# Patient Record
Sex: Female | Born: 1965 | Race: White | Hispanic: No | Marital: Married | Smoking: Former smoker
Health system: Southern US, Community
[De-identification: ages and names within clinical notes are randomized; demographics above are authoritative.]

## PROBLEM LIST (undated history)

## (undated) DIAGNOSIS — D649 Anemia, unspecified: Secondary | ICD-10-CM

## (undated) DIAGNOSIS — E785 Hyperlipidemia, unspecified: Secondary | ICD-10-CM

## (undated) DIAGNOSIS — F419 Anxiety disorder, unspecified: Secondary | ICD-10-CM

## (undated) DIAGNOSIS — T7840XA Allergy, unspecified, initial encounter: Secondary | ICD-10-CM

## (undated) HISTORY — DX: Anxiety disorder, unspecified: F41.9

## (undated) HISTORY — DX: Allergy, unspecified, initial encounter: T78.40XA

## (undated) HISTORY — PX: TONSILLECTOMY: SUR1361

## (undated) HISTORY — DX: Hyperlipidemia, unspecified: E78.5

## (undated) HISTORY — DX: Anemia, unspecified: D64.9

---

## 2007-09-07 ENCOUNTER — Ambulatory Visit: Payer: Self-pay

## 2008-01-04 ENCOUNTER — Ambulatory Visit (HOSPITAL_COMMUNITY): Admission: RE | Admit: 2008-01-04 | Discharge: 2008-01-05 | Payer: Self-pay | Admitting: Neurosurgery

## 2008-11-16 ENCOUNTER — Emergency Department: Payer: Self-pay | Admitting: Emergency Medicine

## 2008-12-01 ENCOUNTER — Ambulatory Visit: Payer: Self-pay | Admitting: Obstetrics and Gynecology

## 2009-05-04 ENCOUNTER — Ambulatory Visit: Payer: Self-pay | Admitting: Obstetrics and Gynecology

## 2009-05-17 IMAGING — CR DG LUMBAR SPINE 2-3V
1 series · 1 of 1 positions shown · non-contrast
Comparison: None.

CLINICAL DATA: L5-S1 discectomy.

LUMBAR SPINE - 2-3 VIEW

[view not recorded]
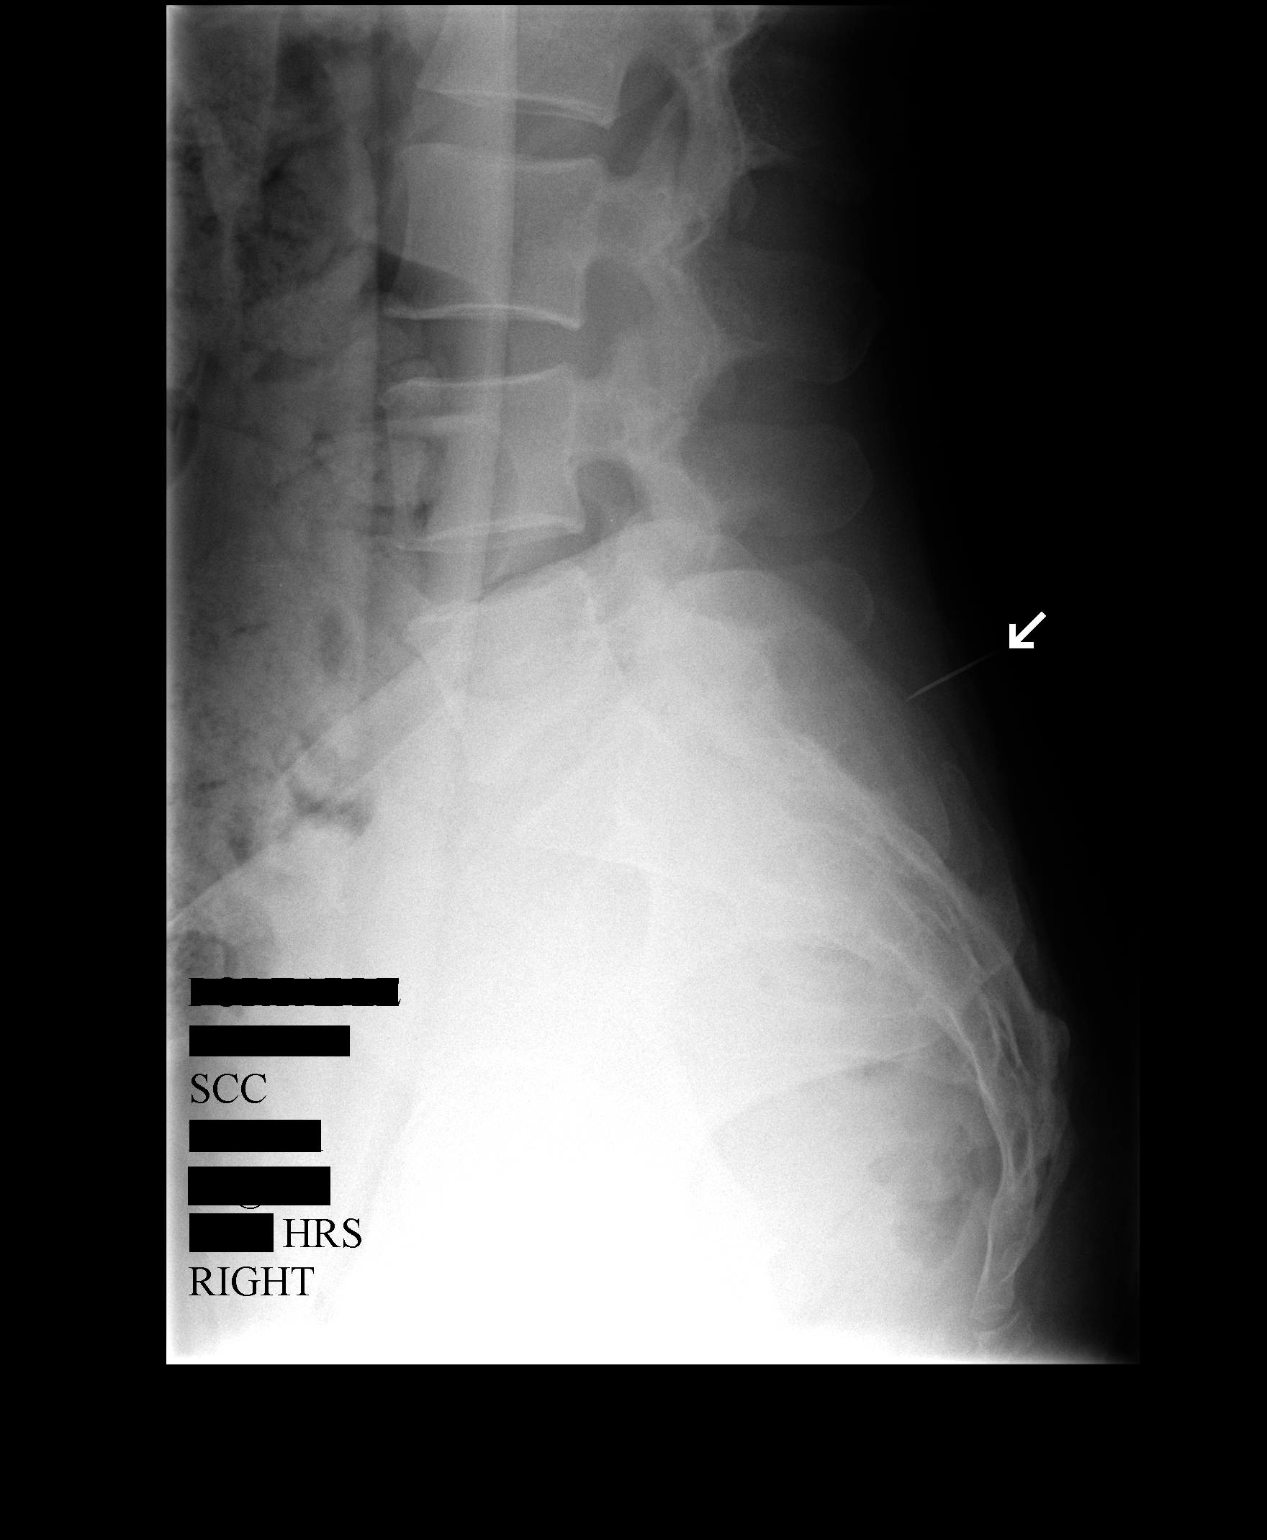

[1 of 1 positions shown; findings below may reference images not displayed]

FINDINGS: We are provided with three images of the lumbar spine in
the lateral projection.  Film labeled #1 at 1170 hours shows a
metallic probe at approximately the S1-2 level.  On film labeled #2
at at 1177 hours, tissues spreaders are in place with a metallic
probe directed toward the L4-5 level. On film labeled #3 at 5056
hours, tissues spreaders are in place metallic probe directed
toward the L5-S1 level.
IMPRESSION: L5-S1 localization.

## 2009-10-09 LAB — CONVERTED CEMR LAB: Pap Smear: NORMAL

## 2009-12-04 ENCOUNTER — Ambulatory Visit: Payer: Self-pay

## 2010-01-11 ENCOUNTER — Ambulatory Visit: Payer: Self-pay | Admitting: Family Medicine

## 2010-01-11 DIAGNOSIS — F411 Generalized anxiety disorder: Secondary | ICD-10-CM | POA: Insufficient documentation

## 2010-01-11 DIAGNOSIS — E785 Hyperlipidemia, unspecified: Secondary | ICD-10-CM

## 2010-01-11 DIAGNOSIS — D649 Anemia, unspecified: Secondary | ICD-10-CM

## 2010-01-11 DIAGNOSIS — R5381 Other malaise: Secondary | ICD-10-CM

## 2010-01-11 DIAGNOSIS — J309 Allergic rhinitis, unspecified: Secondary | ICD-10-CM | POA: Insufficient documentation

## 2010-01-11 DIAGNOSIS — R5383 Other fatigue: Secondary | ICD-10-CM

## 2010-01-11 DIAGNOSIS — J018 Other acute sinusitis: Secondary | ICD-10-CM

## 2010-01-14 ENCOUNTER — Encounter: Payer: Self-pay | Admitting: Family Medicine

## 2010-02-08 ENCOUNTER — Ambulatory Visit: Payer: Self-pay | Admitting: Family Medicine

## 2010-02-08 DIAGNOSIS — J069 Acute upper respiratory infection, unspecified: Secondary | ICD-10-CM | POA: Insufficient documentation

## 2010-02-11 ENCOUNTER — Ambulatory Visit: Payer: Self-pay | Admitting: Family Medicine

## 2010-02-11 DIAGNOSIS — R030 Elevated blood-pressure reading, without diagnosis of hypertension: Secondary | ICD-10-CM

## 2010-03-07 ENCOUNTER — Telehealth: Payer: Self-pay | Admitting: Family Medicine

## 2010-03-27 ENCOUNTER — Ambulatory Visit: Payer: Self-pay | Admitting: Family Medicine

## 2010-04-05 ENCOUNTER — Telehealth: Payer: Self-pay | Admitting: Family Medicine

## 2010-04-15 ENCOUNTER — Telehealth: Payer: Self-pay | Admitting: Family Medicine

## 2010-04-26 ENCOUNTER — Telehealth: Payer: Self-pay | Admitting: Family Medicine

## 2010-05-06 ENCOUNTER — Ambulatory Visit: Payer: Self-pay | Admitting: Family Medicine

## 2010-05-17 ENCOUNTER — Ambulatory Visit: Payer: Self-pay | Admitting: Family Medicine

## 2010-05-17 DIAGNOSIS — G5600 Carpal tunnel syndrome, unspecified upper limb: Secondary | ICD-10-CM

## 2010-05-23 ENCOUNTER — Telehealth: Payer: Self-pay | Admitting: Family Medicine

## 2010-06-28 ENCOUNTER — Telehealth: Payer: Self-pay | Admitting: Family Medicine

## 2010-07-19 ENCOUNTER — Telehealth: Payer: Self-pay | Admitting: Family Medicine

## 2010-08-20 ENCOUNTER — Telehealth: Payer: Self-pay | Admitting: Family Medicine

## 2010-08-26 ENCOUNTER — Ambulatory Visit: Payer: Self-pay | Admitting: Family Medicine

## 2010-08-26 DIAGNOSIS — R1011 Right upper quadrant pain: Secondary | ICD-10-CM

## 2010-08-27 ENCOUNTER — Ambulatory Visit: Payer: Self-pay | Admitting: Family Medicine

## 2010-08-27 ENCOUNTER — Encounter: Payer: Self-pay | Admitting: Family Medicine

## 2010-09-17 ENCOUNTER — Telehealth: Payer: Self-pay | Admitting: Family Medicine

## 2010-09-27 ENCOUNTER — Encounter: Payer: Self-pay | Admitting: Family Medicine

## 2010-10-02 ENCOUNTER — Telehealth: Payer: Self-pay | Admitting: Family Medicine

## 2010-10-17 ENCOUNTER — Encounter: Payer: Self-pay | Admitting: Family Medicine

## 2010-10-17 ENCOUNTER — Ambulatory Visit: Payer: Self-pay | Admitting: General Surgery

## 2010-10-29 ENCOUNTER — Encounter: Payer: Self-pay | Admitting: Family Medicine

## 2010-10-29 NOTE — Assessment & Plan Note (Signed)
Summary: NEW PT TO EST/CPX/CLE   Vital Signs:  Patient profile:   45 year old female Height:      66 inches Weight:      168.6 pounds BMI:     27.31 Temp:     98.6 degrees F oral Pulse rate:   76 / minute Pulse rhythm:   regular BP sitting:   132 / 82  (left arm) Cuff size:   regular  Vitals Entered By: Linde Gillis CMA Duncan Dull) (January 11, 2010 1:54 PM) CC: new patient, establish care   History of Present Illness: 45 yo here to establish care.  1.  Sinus congestion- has had terrible allergic rhinitis for several years.  Feels it has been worse since she had rhinoplasty in her 49s.  Eyes are always itchy, watery.  Sneezes a lot.  Takes OTC seasonal allergy medication.  2 weeks ago, sinus pressure worsened, especially under right eye.  Even feels a little feverish.  Has a dry cough as well.  No wheezing or SOB.  2.  HLD- has been getting labs done at [facility]).  We do not have those labs yet but she was told her cholesterol is elevated.  Taking Fish oil.  Supposed to have labs rechecked next month.  3. Fatigue- feels tired all the time.  Was told at one point that she has iron deficiency anemia.  She is also the coldest person in the room and feels like she can never loose weight.  No CP, DOE, LE edema.  4.  Anxiety- feels like mind is always racing.  Works full time, has 3 kids, one with special needs.  Always on the go, thinking of what she needs to do next.  Placed on Celexa 20 mg daily and Clonazepam 0.5 mg as needed insomnia 2 months ago.  Feels both are helping.  5.  Menorrhagia- apparently bleeding so heavily had ablation done, periods have lightened a little b it.  Taking Ocella as well.  Allergies (verified): No Known Drug Allergies  Past History:  Past Medical History: Allergic rhinitis Anemia-NOS Anxiety Hyperlipidemia  Past Surgical History: Tonsillectomy  Family History: Mom alive, healthy does no know biological father  Social  History: Married Works as Airline pilot.  Has 3 kids- age 53, 14, 94. Former smoker (quit 25 years ago).  Review of Systems      See HPI General:  Complains of fatigue and fever. Eyes:  Denies blurring. ENT:  Denies difficulty swallowing. CV:  Denies chest pain or discomfort and palpitations. Resp:  Denies shortness of breath. GI:  Denies abdominal pain, bloody stools, diarrhea, nausea, and vomiting. GU:  Denies dysuria. MS:  Denies muscle weakness. Derm:  Denies rash. Neuro:  Denies falling down, headaches, and weakness. Psych:  Complains of anxiety; denies depression, easily tearful, panic attacks, sense of great danger, suicidal thoughts/plans, thoughts of violence, and unusual visions or sounds. Endo:  Complains of cold intolerance and weight change. Heme:  Complains of bleeding. Allergy:  Complains of itching eyes, seasonal allergies, and sneezing.  Physical Exam  General:  Well-developed,well-nourished,in no acute distress; alert,appropriate and cooperative throughout examination, sounds congested Eyes:  vision grossly intact, pupils equal, and pupils round.   Ears:  TMs retracted bilaterally Nose:  boggy turbinates, maxillary right sinus TTP Mouth:  pharyngeal erythema.   no exudates Lungs:  Normal respiratory effort, chest expands symmetrically. Lungs are clear to auscultation, no crackles or wheezes. Heart:  Normal rate and regular rhythm. S1 and S2 normal  without gallop, murmur, click, rub or other extra sounds. Abdomen:  Bowel sounds positive,abdomen soft and non-tender without masses, organomegaly or hernias noted. Extremities:  no edema Neurologic:  alert & oriented X3.   Skin:  Intact without suspicious lesions or rashes Psych:  normally interactive, good eye contact, not anxious appearing, and not depressed appearing.     Impression & Recommendations:  Problem # 1:  OTHER ACUTE SINUSITIS (ICD-461.8) Assessment New Given duration and progression of symptoms, will  treat for bacterial sinusitis. Amoxicillin 500 mg two times a day x 10 days. Tessalon perles for cough.   Treat allergic rhinitis as well.  See below. Her updated medication list for this problem includes:    Fluticasone Propionate 50 Mcg/act Susp (Fluticasone propionate) .Marland Kitchen... 2 sprays each nostril once daily    Amoxicillin 500 Mg Caps (Amoxicillin) .Marland Kitchen... 1 tab by mouth two times a day x 10 days    Tessalon Perles 100 Mg Caps (Benzonatate) .Marland Kitchen... 1 tab by mouth as needed cough  Problem # 2:  ALLERGIC RHINITIS (ICD-477.9) Assessment: Deteriorated Will add inhaled steroid, Flonase to Singulair.  Follow up in one month. Her updated medication list for this problem includes:    Fluticasone Propionate 50 Mcg/act Susp (Fluticasone propionate) .Marland Kitchen... 2 sprays each nostril once daily  Problem # 3:  FATIGUE (ICD-780.79) Assessment: New Most likely secondary to busy lifestyle but I would like to get some labs to rule out other causes.  She gets labs drawn at [facility]- given prescription lab order for TSH, CBC, BMET, T3, T4, Vit , FLP.  Pt to call after she has her labs drawn.  Problem # 4:  ANXIETY (ICD-300.00) Assessment: Unchanged Continue Celexa and Clonazepam.  Discussed abuse potential.  She is using Clonazepam only 2-3 times per month. Her updated medication list for this problem includes:    Celexa 20 Mg Tabs (Citalopram hydrobromide) .Marland Kitchen... Take one tablet by mouth daily    Clonazepam 0.5 Mg Tabs (Clonazepam) .Marland Kitchen... As needed  Problem # 5:  HYPERLIPIDEMIA (ICD-272.4) Assessment: Unchanged Awaiting records.  Continue fish oil.  Complete Medication List: 1)  Ocella 3-0.03 Mg Tabs (Drospirenone-ethinyl estradiol) .... Take one tablet by mouth daily 2)  Celexa 20 Mg Tabs (Citalopram hydrobromide) .... Take one tablet by mouth daily 3)  Clonazepam 0.5 Mg Tabs (Clonazepam) .... As needed 4)  Fluticasone Propionate 50 Mcg/act Susp (Fluticasone propionate) .... 2 sprays each nostril once  daily 5)  Amoxicillin 500 Mg Caps (Amoxicillin) .Marland Kitchen.. 1 tab by mouth two times a day x 10 days 6)  Singulair 10 Mg Tabs (Montelukast sodium) .Marland Kitchen.. 1 by mouth daily 7)  Tessalon Perles 100 Mg Caps (Benzonatate) .Marland Kitchen.. 1 tab by mouth as needed cough 8)  Ra Fish Oil 1000 Mg Caps (Omega-3 fatty acids) .... 3 tabs daily  Patient Instructions: 1)  Great to meet you. 2)  Try Singulair for allergies. 3)  Flonase daily as well. 4)  Come back to see me in 1 month. Prescriptions: TESSALON PERLES 100 MG  CAPS (BENZONATATE) 1 tab by mouth as needed cough  #30 x 0   Entered and Authorized by:   Ruthe Mannan MD   Signed by:   Ruthe Mannan MD on 01/11/2010   Method used:   [facility] to        [id number]       [address]       [place], [place]  [address]       Ph:  [phone]       Fax: [phone]   RxID:   [id number] SINGULAIR 10 MG TABS (MONTELUKAST SODIUM) 1 by mouth daily  #30 x 0   Entered and Authorized by:   Ruthe Mannan MD   Signed by:   Ruthe Mannan MD on 01/11/2010   Method used:   Electronically to        [id number]       [address]       [place], [place]  [address]       Ph: [phone]       Fax: [phone]   RxID:   [id number] AMOXICILLIN 500 MG CAPS (AMOXICILLIN) 1 tab by mouth two times a day x 10 days  #20 x 0   Entered and Authorized by:   Ruthe Mannan MD   Signed by:   Ruthe Mannan MD on 01/11/2010   Method used:   Electronically to        [id number]       [address]       [place], [place]  [address]       Ph: [phone]       Fax: [phone]   RxID:   [id number] FLUTICASONE PROPIONATE 50 MCG/ACT  SUSP (FLUTICASONE PROPIONATE) 2 sprays each nostril once daily  #1 vial x 3   Entered and Authorized by:   Ruthe Mannan MD   Signed by:   Ruthe Mannan MD on 01/11/2010   Method used:   Electronically to        [id number]       [address]        [place], [place]  [address]       Ph: [phone]       Fax: [phone]   RxID:   [id number]   Current Allergies (reviewed today): No known allergies   PAP Result Date:  10/09/2009 PAP Result:  normal PAP Next Due:  2 yr Mammogram Result Date:  12/18/2009 Mammogram Result:  normal Mammogram Next Due:  1 yr

## 2010-10-29 NOTE — Progress Notes (Signed)
Summary: refill request for clonazepam  Phone Note Refill Request Message from:  Fax from Pharmacy  Refills Requested: Medication #1:  CLONAZEPAM 0.5 MG TABS as needed   Last Refilled: 04/27/2010 Faxed request from [facility], [phone].  Initial call taken by: Lowella Petties CMA,  May 23, 2010 8:35 AM  Follow-up for Phone Call        Rx called to pharmacy Follow-up by: Linde Gillis CMA Duncan Dull),  May 24, 2010 8:10 AM    Prescriptions: CLONAZEPAM 0.5 MG TABS (CLONAZEPAM) as needed  #30 x 0   Entered and Authorized by:   Ruthe Mannan MD   Signed by:   Ruthe Mannan MD on 05/24/2010   Method used:   Telephoned to ...         RxID:   [id number]

## 2010-10-29 NOTE — Progress Notes (Signed)
Summary: trazodone  Phone Note Refill Request Message from:  Fax from Pharmacy on July 19, 2010 8:58 AM  Refills Requested: Medication #1:  TRAZODONE HCL 100 MG TABS 1 tab by mouth at bedtime..   Last Refilled: 06/06/2010 Refill request from [facility] [address] [phone]  Initial call taken by: Melody Comas,  July 19, 2010 8:59 AM    Prescriptions: TRAZODONE HCL 100 MG TABS (TRAZODONE HCL) 1 tab by mouth at bedtime.  #30 x 1   Entered and Authorized by:   Ruthe Mannan MD   Signed by:   Ruthe Mannan MD on 07/19/2010   Method used:   Electronically to        [id number]       [address]       [place], [place]  [address]       Ph: [phone]       Fax: [phone]   RxID:   [id number]

## 2010-10-29 NOTE — Progress Notes (Signed)
Summary: refill request for clonazepam  Phone Note Refill Request Message from:  Fax from Pharmacy  Refills Requested: Medication #1:  CLONAZEPAM 0.5 MG TABS as needed   Last Refilled: 04/05/2010 Faxed request from [facility], [phone]  Initial call taken by: Lowella Petties CMA,  April 26, 2010 9:06 AM  Follow-up for Phone Call        Rx called to pharmacy Follow-up by: Linde Gillis CMA Duncan Dull),  April 26, 2010 9:29 AM    Prescriptions: CLONAZEPAM 0.5 MG TABS (CLONAZEPAM) as needed  #30 x 0   Entered and Authorized by:   Ruthe Mannan MD   Signed by:   Ruthe Mannan MD on 04/26/2010   Method used:   Telephoned to ...         RxID:   [id number]

## 2010-10-29 NOTE — Assessment & Plan Note (Signed)
Summary: HEARTBURN/CLE   Vital Signs:  Patient profile:   45 year old female Height:      66 inches Weight:      176 pounds BMI:     28.51 Temp:     98.6 degrees F oral Pulse rate:   76 / minute Pulse rhythm:   regular BP sitting:   140 / 92  (left arm) Cuff size:   regular  Vitals Entered By: Linde Gillis CMA Duncan Dull) (August 26, 2010 3:18 PM) CC: heart burn   History of Present Illness: 45 yo here to discuss ?heartburn.  On Thursday, developped severe RUQ pain and nausea after eating. Next day, developped same symptoms after every meal, even vomited once. No fevers. No diarrhea, no blood in stool. Tums did not help. Pepto Bismol did not help.  Current Medications (verified): 1)  Ocella 3-0.03 Mg Tabs (Drospirenone-Ethinyl Estradiol) .... Take One Tablet By Mouth Daily 2)  Celexa 20 Mg Tabs (Citalopram Hydrobromide) .... Take One Tablet By Mouth Daily 3)  Clonazepam 0.5 Mg Tabs (Clonazepam) .... As Needed 4)  Fluticasone Propionate 50 Mcg/act  Susp (Fluticasone Propionate) .... 2 Sprays Each Nostril Once Daily 5)  Singulair 10 Mg Tabs (Montelukast Sodium) .Marland Kitchen.. 1 By Mouth Daily 6)  Ra Fish Oil 1000 Mg Caps (Omega-3 Fatty Acids) .... 3 Tabs Daily 7)  Imitrex 100 Mg Tabs (Sumatriptan Succinate) .Marland Kitchen.. 1 By Mouth At The Onset of Migraine. May Repeat Dose in One Hour If Headache Not Resolved 8)  Trazodone Hcl 100 Mg Tabs (Trazodone Hcl) .Marland Kitchen.. 1 Tab By Mouth At Bedtime.  Allergies (verified): No Known Drug Allergies  Past History:  Past Medical History: Last updated: 01/11/2010 Allergic rhinitis Anemia-NOS Anxiety Hyperlipidemia  Past Surgical History: Last updated: 01/11/2010 Tonsillectomy  Family History: Last updated: 01/11/2010 Mom alive, healthy does no know biological father  Social History: Last updated: 01/11/2010 Married Works as Airline pilot.  Has 3 kids- age 20, 20, 39. Former smoker (quit 25 years ago).  Review of Systems      See HPI General:   Denies fever. GI:  Complains of abdominal pain, indigestion, nausea, and vomiting; denies bloody stools, dark tarry stools, diarrhea, and gas.  Physical Exam  General:  Well-developed,well-nourished,in no acute distress; alert,appropriate and cooperative throughout examination  Abdomen:  Bowel sounds positive,abdomen soft and non-tender without masses, organomegaly or hernias noted.no guarding and no rebound tenderness.   Psych:  normal affect, talkative and pleasant    Impression & Recommendations:  Problem # 1:  RUQ PAIN (ICD-789.01) Assessment New Seems most consistent with gallbladder pathology, likely stones. Will refer for abdominal ultrasound. Orders: Radiology Referral (Radiology)  Complete Medication List: 1)  Ocella 3-0.03 Mg Tabs (Drospirenone-ethinyl estradiol) .... Take one tablet by mouth daily 2)  Celexa 20 Mg Tabs (Citalopram hydrobromide) .... Take one tablet by mouth daily 3)  Clonazepam 0.5 Mg Tabs (Clonazepam) .... As needed 4)  Fluticasone Propionate 50 Mcg/act Susp (Fluticasone propionate) .... 2 sprays each nostril once daily 5)  Singulair 10 Mg Tabs (Montelukast sodium) .Marland Kitchen.. 1 by mouth daily 6)  Ra Fish Oil 1000 Mg Caps (Omega-3 fatty acids) .... 3 tabs daily 7)  Imitrex 100 Mg Tabs (Sumatriptan succinate) .Marland Kitchen.. 1 by mouth at the onset of migraine. may repeat dose in one hour if headache not resolved 8)  Trazodone Hcl 100 Mg Tabs (Trazodone hcl) .Marland Kitchen.. 1 tab by mouth at bedtime.  Patient Instructions: 1)  Please stop by to see Shirlee Limerick on your way out.  Orders Added: 1)  Radiology Referral [Radiology] 2)  Est. Patient Level IV [[address]]    Current Allergies (reviewed today): No known allergies   Appended Document: HEARTBURN/CLE     Allergies: No Known Drug Allergies   Complete Medication List: 1)  Ocella 3-0.03 Mg Tabs (Drospirenone-ethinyl estradiol) .... Take one tablet by mouth daily 2)  Celexa 20 Mg Tabs (Citalopram hydrobromide) .... Take  one tablet by mouth daily 3)  Clonazepam 0.5 Mg Tabs (Clonazepam) .... As needed 4)  Fluticasone Propionate 50 Mcg/act Susp (Fluticasone propionate) .... 2 sprays each nostril once daily 5)  Singulair 10 Mg Tabs (Montelukast sodium) .Marland Kitchen.. 1 by mouth daily 6)  Ra Fish Oil 1000 Mg Caps (Omega-3 fatty acids) .... 3 tabs daily 7)  Imitrex 100 Mg Tabs (Sumatriptan succinate) .Marland Kitchen.. 1 by mouth at the onset of migraine. may repeat dose in one hour if headache not resolved 8)  Trazodone Hcl 100 Mg Tabs (Trazodone hcl) .Marland Kitchen.. 1 tab by mouth at bedtime.  Other Orders: Admin 1st Vaccine (60454) Flu Vaccine 61yrs + [phone])   Orders Added: 1)  Admin 1st Vaccine [90471] 2)  Flu Vaccine 52yrs + [91478]   Flu Vaccine Consent Questions     Do you have a history of severe allergic reactions to this vaccine? no    Any prior history of allergic reactions to egg and/or gelatin? no    Do you have a sensitivity to the preservative Thimersol? no    Do you have a past history of Guillan-Barre Syndrome? no    Do you currently have an acute febrile illness? no    Have you ever had a severe reaction to latex? no    Vaccine information given and explained to patient? yes    Are you currently pregnant? no    Lot Number:[id number]   Exp Date:03/29/2011   Site Given  Left Deltoid IM        .[email or web address]

## 2010-10-29 NOTE — Assessment & Plan Note (Signed)
Summary: 1 m f/u dlo   Vital Signs:  Patient profile:   45 year old female Height:      66 inches Weight:      168.25 pounds BMI:     27.25 Temp:     98.5 degrees F oral Pulse rate:   80 / minute Pulse rhythm:   regular BP sitting:   150 / 100  (left arm) Cuff size:   regular  Vitals Entered By: Linde Gillis CMA Duncan Dull) (Feb 11, 2010 4:06 PM) CC: 1 month follow up   History of Present Illness: 45 yo here for one month follow up.  Seasonal allergies- treated for sinus infection last month. STarted on Singulair and flonase, symptoms much improved.  Does have a head cold right now, took sudafed this morning. BP elevated today but was normal last month.    Current Medications (verified): 1)  Ocella 3-0.03 Mg Tabs (Drospirenone-Ethinyl Estradiol) .... Take One Tablet By Mouth Daily 2)  Celexa 20 Mg Tabs (Citalopram Hydrobromide) .... Take One Tablet By Mouth Daily 3)  Clonazepam 0.5 Mg Tabs (Clonazepam) .... As Needed 4)  Fluticasone Propionate 50 Mcg/act  Susp (Fluticasone Propionate) .... 2 Sprays Each Nostril Once Daily 5)  Singulair 10 Mg Tabs (Montelukast Sodium) .Marland Kitchen.. 1 By Mouth Daily 6)  Ra Fish Oil 1000 Mg Caps (Omega-3 Fatty Acids) .... 3 Tabs Daily 7)  Imitrex 100 Mg Tabs (Sumatriptan Succinate) .Marland Kitchen.. 1 By Mouth At The Onset of Migraine. May Repeat Dose in One Hour If Headache Not Resolved  Allergies (verified): No Known Drug Allergies  Review of Systems      See HPI General:  Denies chills, fatigue, and fever. Eyes:  Denies blurring. ENT:  Complains of nasal congestion; denies ear discharge and hoarseness. Resp:  Denies cough and shortness of breath.  Physical Exam  General:  Well-developed,well-nourished,in no acute distress; alert,appropriate and cooperative throughout examination Ears:  External ear exam shows no significant lesions or deformities.  Otoscopic examination reveals clear canals, tympanic membranes are intact bilaterally without bulging,  retraction, inflammation or discharge. Hearing is grossly normal bilaterally. Nose:  mild congestion Mouth:  pharynx pink and moist, no erythema, and no exudates.   Lungs:  Normal respiratory effort, chest expands symmetrically. Lungs are clear to auscultation, no crackles or wheezes. Heart:  Normal rate and regular rhythm. S1 and S2 normal without gallop, murmur, click, rub or other extra sounds. Psych:  normal affect, talkative and pleasant    Impression & Recommendations:  Problem # 1:  ALLERGIC RHINITIS (ICD-477.9) Assessment Improved Continue current meds. Her updated medication list for this problem includes:    Fluticasone Propionate 50 Mcg/act Susp (Fluticasone propionate) .Marland Kitchen... 2 sprays each nostril once daily  Problem # 2:  ELEVATED BLOOD PRESSURE WITHOUT DIAGNOSIS OF HYPERTENSION (ICD-796.2) New.  Likely due to Sudafed.  Advised coming in for BP check next week.  Complete Medication List: 1)  Ocella 3-0.03 Mg Tabs (Drospirenone-ethinyl estradiol) .... Take one tablet by mouth daily 2)  Celexa 20 Mg Tabs (Citalopram hydrobromide) .... Take one tablet by mouth daily 3)  Clonazepam 0.5 Mg Tabs (Clonazepam) .... As needed 4)  Fluticasone Propionate 50 Mcg/act Susp (Fluticasone propionate) .... 2 sprays each nostril once daily 5)  Singulair 10 Mg Tabs (Montelukast sodium) .Marland Kitchen.. 1 by mouth daily 6)  Ra Fish Oil 1000 Mg Caps (Omega-3 fatty acids) .... 3 tabs daily 7)  Imitrex 100 Mg Tabs (Sumatriptan succinate) .Marland Kitchen.. 1 by mouth at the onset of migraine. may repeat  dose in one hour if headache not resolved Prescriptions: CLONAZEPAM 0.5 MG TABS (CLONAZEPAM) as needed  #30 x 0   Entered and Authorized by:   Ruthe Mannan MD   Signed by:   Ruthe Mannan MD on 02/11/2010   Method used:   Print then Give to Patient   RxID:   [id number] IMITREX 100 MG TABS (SUMATRIPTAN SUCCINATE) 1 by mouth at the onset of migraine. May repeat dose in one hour if headache not resolved  #10 x 3   Entered  and Authorized by:   Ruthe Mannan MD   Signed by:   Ruthe Mannan MD on 02/11/2010   Method used:   Elect[facility] to        [id number]       [address]       [place], [place]  [address]       Ph: [phone]       Fax: [phone]   RxID:   [id number]   Current Allergies (reviewed today): No known allergies

## 2010-10-29 NOTE — Progress Notes (Signed)
Summary: Rx Imitrex  Phone Note Refill Request Call back at [phone] Message [facility] on June 28, 2010 9:26 AM  Refills Requested: Medication #1:  IMITREX 100 MG TABS 1 by mouth at the onset of migraine. May repeat dose in one hour if headache not resolved Received E-script request please advise.   Method Requested: Electronic Initial call taken by: Linde Gillis CMA Duncan Dull),  June 28, 2010 9:27 AM    Prescriptions: IMITREX 100 MG TABS (SUMATRIPTAN SUCCINATE) 1 by mouth at the onset of migraine. May repeat dose in one hour if headache not resolved  #10 x 3   Entered and Authorized by:   Ruthe Mannan MD   Signed by:   Ruthe Mannan MD on 06/28/2010   Method used:   Electronically to        [id number]       [address]       [place], [place]  [address]       Ph: [phone]       Fax: [phone]   RxID:   [id number]

## 2010-10-29 NOTE — Assessment & Plan Note (Signed)
Summary: TROUBLE SLEEPING/DLO   Vital Signs:  Patient profile:   45 year old female Height:      66 inches Weight:      167.38 pounds BMI:     27.11 Temp:     97.5 degrees F oral Pulse rate:   60 / minute Pulse rhythm:   regular BP sitting:   130 / 90  (left arm) Cuff size:   regular  Vitals Entered By: Linde Gillis CMA [facility]) (May 06, 2010 12:24 PM) CC: trouble sleeping   History of Present Illness: 45 yo here for trouble sleeping.  Has been taking 2 clonazepma 0.5 mg nightly to fall asleep.  Helps her to fall asleep but always wakes up a few hours later and cannot fall asleep. Mind is racing- under more stress. Husband has had medical problems, she is applying for a job promotion.  On Celexa 20 mg daily. Has had issues with anxiety.  Denies any symptoms of depression other than fatigue. No SI or HI.  Current Medications (verified): 1)  Ocella 3-0.03 Mg Tabs (Drospirenone-Ethinyl Estradiol) .... Take One Tablet By Mouth Daily 2)  Celexa 20 Mg Tabs (Citalopram Hydrobromide) .... Take One Tablet By Mouth Daily 3)  Clonazepam 0.5 Mg Tabs (Clonazepam) .... As Needed 4)  Fluticasone Propionate 50 Mcg/act  Susp (Fluticasone Propionate) .... 2 Sprays Each Nostril Once Daily 5)  Singulair 10 Mg Tabs (Montelukast Sodium) .Marland Kitchen.. 1 By Mouth Daily 6)  Ra Fish Oil 1000 Mg Caps (Omega-3 Fatty Acids) .... 3 Tabs Daily 7)  Imitrex 100 Mg Tabs (Sumatriptan Succinate) .Marland Kitchen.. 1 By Mouth At The Onset of Migraine. May Repeat Dose in One Hour If Headache Not Resolved 8)  Trazodone Hcl 100 Mg Tabs (Trazodone Hcl) .Marland Kitchen.. 1 Tab By Mouth At Bedtime.  Allergies (verified): No Known Drug Allergies  Past History:  Past Medical History: Last updated: 01/11/2010 Allergic rhinitis Anemia-NOS Anxiety Hyperlipidemia  Past Surgical History: Last updated: 01/11/2010 Tonsillectomy  Family History: Last updated: 01/11/2010 Mom alive, healthy does no know biological father  Social History: Last  updated: 01/11/2010 Married Works as Airline pilot.  Has 3 kids- age 42, 87, 38. Former smoker (quit 25 years ago).  Review of Systems      See HPI Psych:  Complains of anxiety; denies depression, easily angered, easily tearful, irritability, mental problems, panic attacks, sense of great danger, suicidal thoughts/plans, thoughts of violence, unusual visions or sounds, and thoughts /plans of harming others.  Physical Exam  General:  Well-developed,well-nourished,in no acute distress; alert,appropriate and cooperative throughout examination  Psych:  normal affect, talkative and pleasant    Impression & Recommendations:  Problem # 1:  ANXIETY (ICD-300.00) Assessment Deteriorated Now with insomnia. Time spent with patient 25 minutes, more than 50% of this time was spent counseling patient on anxiety and insomnia. Discussed importance of treating her anxiety.  We could increase her Celexa but she feels ok during the day. Will add Trazadone, follow up in one month. Her updated medication list for this problem includes:    Celexa 20 Mg Tabs (Citalopram hydrobromide) .Marland Kitchen... Take one tablet by mouth daily    Clonazepam 0.5 Mg Tabs (Clonazepam) .Marland Kitchen... As needed    Trazodone Hcl 100 Mg Tabs (Trazodone hcl) .Marland Kitchen... 1 tab by mouth at bedtime.  Complete Medication List: 1)  Ocella 3-0.03 Mg Tabs (Drospirenone-ethinyl estradiol) .... Take one tablet by mouth daily 2)  Celexa 20 Mg Tabs (Citalopram hydrobromide) .... Take one tablet by mouth daily 3)  Clonazepam 0.5  Mg Tabs (Clonazepam) .... As needed 4)  Fluticasone Propionate 50 Mcg/act Susp (Fluticasone propionate) .... 2 sprays each nostril once daily 5)  Singulair 10 Mg Tabs (Montelukast sodium) .Marland Kitchen.. 1 by mouth daily 6)  Ra Fish Oil 1000 Mg Caps (Omega-3 fatty acids) .... 3 tabs daily 7)  Imitrex 100 Mg Tabs (Sumatriptan succinate) .Marland Kitchen.. 1 by mouth at the onset of migraine. may repeat dose in one hour if headache not resolved 8)  Trazodone Hcl 100  Mg Tabs (Trazodone hcl) .Marland Kitchen.. 1 tab by mouth at bedtime. Prescriptions: TRAZODONE HCL 100 MG TABS (TRAZODONE HCL) 1 tab by mouth at bedtime.  #30 x 1   Entered and Authorized by:   Ruthe Mannan MD   Signed by:   Ruthe Mannan MD on 05/06/2010   Method used:   Electronically to        [id number]       [address]       [place], [place]  [address]       Ph: [phone]       Fax: [phone]   RxID:   [id number]   Current Allergies (reviewed today): No known allergies

## 2010-10-29 NOTE — Progress Notes (Signed)
Summary: refill request for clonazepam  Phone Note Refill Request Message from:  Fax from Pharmacy  Refills Requested: Medication #1:  CLONAZEPAM 0.5 MG TABS as needed   Last Refilled: 02/11/2010 Faxed request from [facility].  [phone]  Initial call taken by: Lowella Petties CMA,  March 07, 2010 3:32 PM  Follow-up for Phone Call        Tried calling the pharmacy several times, keeps saying your call did not go through, number verified, will call back later.  Linde Gillis CMA [facility])  March 07, 2010 3:59 PM   Tried calling the pharmacy several times, keeps saying your call did not go through, number verified, will call again in the morning.  Linde Gillis CMA [facility])  March 07, 2010 4:58 PM   Rx called to pharmacy. Follow-up by: Linde Gillis CMA [facility] Dull),  March 08, 2010 8:19 AM    Prescriptions: CLONAZEPAM 0.5 MG TABS (CLONAZEPAM) as needed  #30 x 0   Entered and Authorized by:   Ruthe Mannan MD   Signed by:   Ruthe Mannan MD on 03/07/2010   Method used:   Telephoned to ...         RxID:   [id number]

## 2010-10-29 NOTE — Assessment & Plan Note (Signed)
Summary: pain in wrist/alc   Vital Signs:  Patient profile:   45 year old female Height:      66 inches Weight:      168.38 pounds BMI:     27.28 Temp:     97.9 degrees F oral Pulse rate:   80 / minute Pulse rhythm:   regular BP sitting:   130 / 80  (left arm) Cuff size:   regular  Vitals Entered By: Linde Gillis CMA [facility] Dull) (May 17, 2010 3:35 PM) CC: bilateral wrist pain   History of Present Illness: 45 yo with h/o carpal tunnel here for worsening symptoms.  Works on a computer all day long, at night forearms ache and fingers tingle. Used to wear splints at night which really helped but she lost them. No weakness in her wrists or hands. Never had injections in her wrist.  Current Medications (verified): 1)  Ocella 3-0.03 Mg Tabs (Drospirenone-Ethinyl Estradiol) .... Take One Tablet By Mouth Daily 2)  Celexa 20 Mg Tabs (Citalopram Hydrobromide) .... Take One Tablet By Mouth Daily 3)  Clonazepam 0.5 Mg Tabs (Clonazepam) .... As Needed 4)  Fluticasone Propionate 50 Mcg/act  Susp (Fluticasone Propionate) .... 2 Sprays Each Nostril Once Daily 5)  Singulair 10 Mg Tabs (Montelukast Sodium) .Marland Kitchen.. 1 By Mouth Daily 6)  Ra Fish Oil 1000 Mg Caps (Omega-3 Fatty Acids) .... 3 Tabs Daily 7)  Imitrex 100 Mg Tabs (Sumatriptan Succinate) .Marland Kitchen.. 1 By Mouth At The Onset of Migraine. May Repeat Dose in One Hour If Headache Not Resolved 8)  Trazodone Hcl 100 Mg Tabs (Trazodone Hcl) .Marland Kitchen.. 1 Tab By Mouth At Bedtime.  Allergies (verified): No Known Drug Allergies  Past History:  Past Medical History: Last updated: 01/11/2010 Allergic rhinitis Anemia-NOS Anxiety Hyperlipidemia  Past Surgical History: Last updated: 01/11/2010 Tonsillectomy  Family History: Last updated: 01/11/2010 Mom alive, healthy does no know biological father  Social History: Last updated: 01/11/2010 Married Works as Airline pilot.  Has 3 kids- age 68, 45, 74. Former smoker (quit 25 years ago).  Review of  Systems      See HPI MS:  Denies muscle weakness. Neuro:  Complains of numbness and tingling; denies weakness.  Physical Exam  General:  Well-developed,well-nourished,in no acute distress; alert,appropriate and cooperative throughout examination  Msk:  Pos Tinnel's test bilaterally,  normal strength. Neurologic:  alert & oriented X3.   Psych:  normal affect, talkative and pleasant    Impression & Recommendations:  Problem # 1:  CARPAL TUNNEL SYNDROME, BILATERAL (ICD-354.0) Assessment New Given wrist splints today. Advised stretches, NSAIDs. If symptoms do not improve, discussed possible injections, surgical interventions. Orders: Wrist & Forearm Splint any size (V4098) Wrist & Forearm Splint any size (J1914)  Complete Medication List: 1)  Ocella 3-0.03 Mg Tabs (Drospirenone-ethinyl estradiol) .... Take one tablet by mouth daily 2)  Celexa 20 Mg Tabs (Citalopram hydrobromide) .... Take one tablet by mouth daily 3)  Clonazepam 0.5 Mg Tabs (Clonazepam) .... As needed 4)  Fluticasone Propionate 50 Mcg/act Susp (Fluticasone propionate) .... 2 sprays each nostril once daily 5)  Singulair 10 Mg Tabs (Montelukast sodium) .Marland Kitchen.. 1 by mouth daily 6)  Ra Fish Oil 1000 Mg Caps (Omega-3 fatty acids) .... 3 tabs daily 7)  Imitrex 100 Mg Tabs (Sumatriptan succinate) .Marland Kitchen.. 1 by mouth at the onset of migraine. may repeat dose in one hour if headache not resolved 8)  Trazodone Hcl 100 Mg Tabs (Trazodone hcl) .Marland Kitchen.. 1 tab by mouth at bedtime.  Current Allergies (reviewed  today): No known allergies

## 2010-10-29 NOTE — Assessment & Plan Note (Signed)
Summary: ?SINUS INFECTION/CLE   Vital Signs:  Patient profile:   45 year old female Height:      66 inches Weight:      167.13 pounds BMI:     27.07 Temp:     98.1 degrees F oral Pulse rate:   80 / minute Pulse rhythm:   regular BP sitting:   130 / 90  (left arm) Cuff size:   regular  Vitals Entered By: Linde Gillis CMA [facility] Dull) (March 27, 2010 12:23 PM) CC: ? sinus infection   History of Present Illness: 45 yo with over 1 week of progressive URI symptoms.  Started with a runny nose, sneezing. Now has sinus pressure, teeth are hurting, chills, ears popping. No cough, wheezing, CP or SOB.  Current Medications (verified): 1)  Ocella 3-0.03 Mg Tabs (Drospirenone-Ethinyl Estradiol) .... Take One Tablet By Mouth Daily 2)  Celexa 20 Mg Tabs (Citalopram Hydrobromide) .... Take One Tablet By Mouth Daily 3)  Clonazepam 0.5 Mg Tabs (Clonazepam) .... As Needed 4)  Fluticasone Propionate 50 Mcg/act  Susp (Fluticasone Propionate) .... 2 Sprays Each Nostril Once Daily 5)  Singulair 10 Mg Tabs (Montelukast Sodium) .Marland Kitchen.. 1 By Mouth Daily 6)  Ra Fish Oil 1000 Mg Caps (Omega-3 Fatty Acids) .... 3 Tabs Daily 7)  Imitrex 100 Mg Tabs (Sumatriptan Succinate) .Marland Kitchen.. 1 By Mouth At The Onset of Migraine. May Repeat Dose in One Hour If Headache Not Resolved 8)  Augmentin 875-125 Mg Tabs (Amoxicillin-Pot Clavulanate) .Marland Kitchen.. 1 By Mouth 2 Times Daily X 10 Days  Allergies (verified): No Known Drug Allergies  Past History:  Past Medical History: Last updated: 01/11/2010 Allergic rhinitis Anemia-NOS Anxiety Hyperlipidemia  Past Surgical History: Last updated: 01/11/2010 Tonsillectomy  Family History: Last updated: 01/11/2010 Mom alive, healthy does no know biological father  Social History: Last updated: 01/11/2010 Married Works as Airline pilot.  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 Former smoker (quit 25 years ago).  Review of Systems      See HPI General:  Complains of chills; denies fever. ENT:   Complains of nasal congestion, sinus pressure, and sore throat. Resp:  Denies cough, shortness of breath, sputum productive, and wheezing.  Physical Exam  General:  Well-developed,well-nourished,in no acute distress; alert,appropriate and cooperative throughout examination VSS- non toxic appearing Nose:  mucosal erythema, edema. sinuses TTP throughout Mouth:  Oral mucosa and oropharynx without lesions or exudates.  Teeth in good repair. Lungs:  Normal respiratory effort, chest expands symmetrically. Lungs are clear to auscultation, no crackles or wheezes. Heart:  Normal rate and regular rhythm. S1 and S2 normal without gallop, murmur, click, rub or other extra sounds. Extremities:  no edema Psych:  normal affect, talkative and pleasant    Impression & Recommendations:  Problem # 1:  OTHER ACUTE SINUSITIS (ICD-461.8) Assessment New Given duration and progression of symptoms, will treat with 10 day course of Augmetin. See pt instructions for details. Her updated medication list for this problem includes:    Fluticasone Propionate 50 Mcg/act Susp (Fluticasone propionate) .Marland Kitchen... 2 sprays each nostril once daily    Augmentin 875-125 Mg Tabs (Amoxicillin-pot clavulanate) .Marland Kitchen... 1 by mouth 2 times daily x 10 days  Complete Medication List: 1)  Ocella 3-0.03 Mg Tabs (Drospirenone-ethinyl estradiol) .... Take one tablet by mouth daily 2)  Celexa 20 Mg Tabs (Citalopram hydrobromide) .... Take one tablet by mouth daily 3)  Clonazepam 0.5 Mg Tabs (Clonazepam) .... As needed 4)  Fluticasone Propionate 50 Mcg/act Susp (Fluticasone propionate) .... 2 sprays each  nostril once daily 5)  Singulair 10 Mg Tabs (Montelukast sodium) .Marland Kitchen.. 1 by mouth daily 6)  Ra Fish Oil 1000 Mg Caps (Omega-3 fatty acids) .... 3 tabs daily 7)  Imitrex 100 Mg Tabs (Sumatriptan succinate) .Marland Kitchen.. 1 by mouth at the onset of migraine. may repeat dose in one hour if headache not resolved 8)  Augmentin 875-125 Mg Tabs  (Amoxicillin-pot clavulanate) .Marland Kitchen.. 1 by mouth 2 times daily x 10 days  Patient Instructions: 1)  Take antibiotic as directed.  Drink lots of fluids.  Treat sympotmatically with Mucinex, nasal saline irrigation, and Tylenol/Ibuprofen. Also try claritin D or zyrtec D over the counter- two times a day as needed ( have to sign for them at pharmacy). You can use warm compresses.  Cough suppressant at night. Call if not improving as expected in 5-7 days.  Prescriptions: AUGMENTIN 875-125 MG TABS (AMOXICILLIN-POT CLAVULANATE) 1 by mouth 2 times daily x 10 days  #20 x 0   Entered and Authorized by:   Ruthe Mannan MD   Signed by:   Ruthe Mannan MD on 03/27/2010   Method used:   Electronically to        [id number]       [address]       [place], [place]  [address]       Ph: [phone]       Fax: [phone]   RxID:   [id number]   Current Allergies (reviewed today): No known allergies

## 2010-10-29 NOTE — Progress Notes (Signed)
Summary: Rx Imitrex  Phone Note Refill Request Call back at [phone] Message from:  [facility] on April 26, 2010 2:95 AM  Refills Requested: Medication #1:  IMITREX 100 MG TABS 1 by mouth at the onset of migraine. May repeat dose in one hour if headache not resolved. Received E-script request please advise.   Method Requested: Telephone to Pharmacy Initial call taken by: Linde Gillis CMA [facility]ull),  April 26, 2010 8:01 AM    Prescriptions: IMITREX 100 MG TABS (SUMATRIPTAN SUCCINATE) 1 by mouth at the onset of migraine. May repeat dose in one hour if headache not resolved  #10 x 3   Entered and Authorized by:   Ruthe Mannan MD   Signed by:   Ruthe Mannan MD on 04/26/2010   Method used:   Electronically to        [id number]       [address]       [place], [place]  [address]       Ph: [phone]       Fax: [phone]   RxID:   [id number]

## 2010-10-29 NOTE — Progress Notes (Signed)
Summary: refill request for clonazepam  Phone Note Refill Request Message from:  Fax from Pharmacy  Refills Requested: Medication #1:  CLONAZEPAM 0.5 MG TABS as needed   Last Refilled: 03/08/2010 Faxed request from [facility], [phone]   Initial call taken by: Lowella Petties CMA,  April 05, 2010 8:46 AM  Follow-up for Phone Call        Rx called to pharmacy Follow-up by: Linde Gillis CMA [facility] Dull),  April 05, 2010 10:54 AM    Prescriptions: CLONAZEPAM 0.5 MG TABS (CLONAZEPAM) as needed  #30 x 0   Entered and Authorized by:   Ruthe Mannan MD   Signed by:   Ruthe Mannan MD on 04/05/2010   Method used:   Telephoned to ...         RxID:   [id number]

## 2010-10-29 NOTE — Progress Notes (Signed)
Summary: Rx Imitrex  Phone Note Refill Request Call back at [phone] Message from:  [facility] on August 20, 2010 3:17 PM  Refills Requested: Medication #1:  IMITREX 100 MG TABS 1 by mouth at the onset of migraine. May repeat dose in one hour if headache not resolved   Last Refilled: 07/29/2010 Received faxed refill request please advise.   Method Requested: Telephone to Pharmacy Initial call taken by: Linde Gillis CMA Duncan Dull),  August 20, 2010 3:17 PM    Prescriptions: IMITREX 100 MG TABS (SUMATRIPTAN SUCCINATE) 1 by mouth at the onset of migraine. May repeat dose in one hour if headache not resolved  #10 x 3   Entered and Authorized by:   Ruthe Mannan MD   Signed by:   Ruthe Mannan MD on 08/20/2010   Method used:   Electronically to        [id number]       [address]       [place], [place]  [address]       Ph: [phone]       Fax: [phone]   RxID:   [id number]

## 2010-10-29 NOTE — Assessment & Plan Note (Signed)
Summary: EAR/CLE   Vital Signs:  Patient profile:   45 year old female Height:      66 inches Weight:      169 pounds BMI:     27.38 Temp:     98.4 degrees F oral Pulse rate:   72 / minute Pulse rhythm:   regular BP sitting:   124 / 82  (left arm) Cuff size:   regular  Vitals Entered By: Lewanda Rife LPN (Feb 08, 2010 2:48 PM) CC: Right earache started 02/07/10. Head slightly congested and h/a with some drainage at back of throat   History of Present Illness: has had sensitive ears for years --- had serious infx years ago  R ear started yesterday with pain and pressure  no fever  is having some sinus symptoms (takes flonase and singular)  has a stuffy and runny nose/ post nasal drainage and sore ear since yesterday   no new otc meds  no dizziness or n/v  no swimming or water in ear   Allergies (verified): No Known Drug Allergies  Past History:  Past Medical History: Last updated: 01/11/2010 Allergic rhinitis Anemia-NOS Anxiety Hyperlipidemia  Past Surgical History: Last updated: 01/11/2010 Tonsillectomy  Family History: Last updated: 01/11/2010 Mom alive, healthy does no know biological father  Social History: Last updated: 01/11/2010 Married Works as Airline pilot.  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 Former smoker (quit 25 years ago).  Review of Systems General:  Denies chills, fatigue, fever, loss of appetite, and malaise. Eyes:  Denies blurring, double vision, and eye irritation. ENT:  Complains of earache, nasal congestion, and postnasal drainage; denies decreased hearing, ear discharge, sinus pressure, and sore throat. CV:  Denies chest pain or discomfort and palpitations. Resp:  Denies cough, shortness of breath, and wheezing. GI:  Denies abdominal pain and nausea. Derm:  Denies poor wound healing and rash. Neuro:  Denies sensation of room spinning.  Physical Exam  General:  Well-developed,well-nourished,in no acute distress; alert,appropriate and  cooperative throughout examination Head:  normocephalic, atraumatic, and no abnormalities observed.  no sinus or temporal tenderness  Eyes:  vision grossly intact, pupils equal, pupils round, pupils reactive to light, and no injection.   Ears:  R TM -- dull with small eff L TM dull no erythema or bulgin  Nose:  nares are moderately congested  Mouth:  pharynx pink and moist, no erythema, and no exudates.   Neck:  No deformities, masses, or tenderness noted. Lungs:  Normal respiratory effort, chest expands symmetrically. Lungs are clear to auscultation, no crackles or wheezes. Heart:  Normal rate and regular rhythm. S1 and S2 normal without gallop, murmur, click, rub or other extra sounds. Extremities:  no edema Skin:  Intact without suspicious lesions or rashes Cervical Nodes:  No lymphadenopathy noted Psych:  normal affect, talkative and pleasant    Impression & Recommendations:  Problem # 1:  URI (ICD-465.9) Assessment New  early viral uri with congestion and R ear pain from ETD with visible effusion  no signs of bacterial infx  recommend continue all meds tylenol or aleve for pain sudafed short acting otc for congestion and ETD , along with nasal saline f/u with Dr Dayton Martes as planned- she can re check ear adv to update if fever or other symptoms Her updated medication list for this problem includes:    Tessalon Perles 100 Mg Caps (Benzonatate) .Marland Kitchen... 1 tab by mouth as needed cough  Orders: Prescription Created Electronically [phone])  Complete Medication List: 1)  Anna Estrada  3-0.03 Mg Tabs (Drospirenone-ethinyl estradiol) .... Take one tablet by mouth daily 2)  Celexa 20 Mg Tabs (Citalopram hydrobromide) .... Take one tablet by mouth daily 3)  Clonazepam 0.5 Mg Tabs (Clonazepam) .... As needed 4)  Fluticasone Propionate 50 Mcg/act Susp (Fluticasone propionate) .... 2 sprays each nostril once daily 5)  Singulair 10 Mg Tabs (Montelukast sodium) .Marland Kitchen.. 1 by mouth daily 6)  Tessalon  Perles 100 Mg Caps (Benzonatate) .Marland Kitchen.. 1 tab by mouth as needed cough 7)  Ra Fish Oil 1000 Mg Caps (Omega-3 fatty acids) .... 3 tabs daily  Patient Instructions: 1)  continue your singulair and your flonase  2)  I will send these to your pharmacy 3)  go ahead and get sudafed short acting -- and take it during the day  4)  this will help decongest the nose and ear tube 5)  zinc losenges may be helpful  6)  saline nasal spray or netti pot is also helpful 7)  watch for fever/ worse ear pain or not imroving in 5-7 days  8)  follow up with Dr Dayton Martes as planned  9)  tylenol or aleve for pain as needed  Prescriptions: SINGULAIR 10 MG TABS (MONTELUKAST SODIUM) 1 by mouth daily  #30 x 1   Entered and Authorized by:   Judith Part MD   Signed by:   Judith Part MD on 02/08/2010   Method used:   Electronically to        [id number]       [address]       [place], [place]  [address]       Ph: [phone]       Fax: [phone]   RxID:   [id number] FLUTICASONE PROPIONATE 50 MCG/ACT  SUSP (FLUTICASONE PROPIONATE) 2 sprays each nostril once daily  #1 mdi x 1   Entered and Authorized by:   Judith Part MD   Signed by:   Judith Part MD on 02/08/2010   Method used:   Electronically to        [id number]       [address]       [place], [place]  [address]       Ph: [phone]       Fax: [phone]   RxID:   [id number]   Current Allergies (reviewed today): No known allergies

## 2010-10-29 NOTE — Progress Notes (Signed)
Summary: Rx Imitrex  Phone Note Refill Request Call back at [phone] Message [facility] on April 15, 2010 9:41 AM  Refills Requested: Medication #1:  IMITREX 100 MG TABS 1 by mouth at the onset of migraine. May repeat dose in one hour if headache not resolved. LR, no date sent   Method Requested: Electronic Initial call taken by: Sydell Axon LPN,  April 15, 2010 9:43 AM    Prescriptions: IMITREX 100 MG TABS (SUMATRIPTAN SUCCINATE) 1 by mouth at the onset of migraine. May repeat dose in one hour if headache not resolved  #10 x 3   Entered and Authorized by:   Ruthe Mannan MD   Signed by:   Ruthe Mannan MD on 04/15/2010   Method used:   Electronically to        [id number]       [address]       [place], [place]  [address]       Ph: [phone]       Fax: [phone]   RxID:   [id number]

## 2010-10-31 NOTE — Op Note (Signed)
Summary: Laparoscopic Cholecystectomy/[facility]  Laparoscopic Cholecystectomy/[facility]   Imported By: Maryln Gottron 10/22/2010 12:35:37  _____________________________________________________________________  External Attachment:    Type:   Image     Comment:   External Document  Appended Document: Laparoscopic Cholecystectomy/[facility] lap cholecycstectomy

## 2010-10-31 NOTE — Progress Notes (Signed)
Summary: Rx Fluticasone  Phone Note Refill Request Call back at [phone] Message from:  [facility] [facility] on October 02, 4538 4:09 PM  Refills Requested: Medication #1:  FLUTICASONE PROPIONATE 50 MCG/ACT  SUSP 2 sprays each nostril once daily   Last Refilled: 08/24/2010 Received E-script request please advise.   Method Requested: Electronic Initial call taken by: Linde Gillis CMA Duncan Dull),  October 02, 2010 4:10 PM    Prescriptions: FLUTICASONE PROPIONATE 50 MCG/ACT  SUSP (FLUTICASONE PROPIONATE) 2 sprays each nostril once daily  #16 Gram x 0   Entered and Authorized by:   Ruthe Mannan MD   Signed by:   Ruthe Mannan MD on 10/03/2010   Method used:   [facility]ically [facility]        [id number]       [address]       [place], [place]  [address]       Ph: [phone]       Fax: [phone]   RxID:   [id number]

## 2010-10-31 NOTE — Consult Note (Signed)
Summary: [facility]  [facility]   Imported By: Lanelle Bal 10/09/2010 13:06:24  _____________________________________________________________________  External Attachment:    Type:   Image     Comment:   External Document

## 2010-10-31 NOTE — Progress Notes (Signed)
Summary: trazodone  Phone Note Refill Request Message from:  Fax from Pharmacy on September 17, 2010 9:41 AM  Refills Requested: Medication #1:  TRAZODONE HCL 100 MG TABS 1 tab by mouth at bedtime..   Last Refilled: 08/17/2010 Refill request from [facility] [address] [phone].   Initial call taken by: Melody Comas,  September 17, 2010 9:41 AM    Prescriptions: TRAZODONE HCL 100 MG TABS (TRAZODONE HCL) 1 tab by mouth at bedtime.  #30 x 1   Entered and Authorized by:   Ruthe Mannan MD   Signed by:   Ruthe Mannan MD on 09/18/2010   Method used:   Electronically [facility]        [id number]       [address]       [place], [place]  [address]       Ph: [phone]       Fax: [phone]   RxID:   [id number]

## 2010-11-14 ENCOUNTER — Ambulatory Visit (INDEPENDENT_AMBULATORY_CARE_PROVIDER_SITE_OTHER): Payer: Managed Care, Other (non HMO) | Admitting: Family Medicine

## 2010-11-14 ENCOUNTER — Encounter: Payer: Self-pay | Admitting: Family Medicine

## 2010-11-14 DIAGNOSIS — Z3009 Encounter for other general counseling and advice on contraception: Secondary | ICD-10-CM

## 2010-11-14 NOTE — Letter (Signed)
Summary: [facility]  [facility]   Imported By: Kassie Mends 11/06/2010 11:55:42  _____________________________________________________________________  External Attachment:    Type:   Image     Comment:   External Document

## 2010-11-15 ENCOUNTER — Ambulatory Visit: Payer: Self-pay | Admitting: Family Medicine

## 2010-11-20 NOTE — Assessment & Plan Note (Signed)
Summary: DISCUSS CHANGING BCP,DISCUSS ALLERGIES/CLE  [employer]   Vital Signs:  Patient profile:   45 year old female Height:      66 inches Weight:      181.25 pounds BMI:     29.36 Temp:     98.3 degrees F oral Pulse rate:   86 / minute Pulse rhythm:   regular BP sitting:   120 / 84  (left arm) Cuff size:   regular  Vitals Entered By: Linde Gillis CMA Duncan Dull) (November 14, 2010 3:23 PM) CC: discuss changing birth contr   History of Present Illness: 45 yo here to discuss OCPs.  OCP- on Ocella, which has been controlling her bleeding.  She is also s/p uterine ablation so her periods are much lighter.  She is interested, however in learning about other options, most specifically mirena IUD. Pt is a non smoker(quit over 20 years ago), no h/o PID. She does not want any more children. No h/o DVTs. G3P3  Current Medications (verified): 1)  Ocella 3-0.03 Mg Tabs (Drospirenone-Ethinyl Estradiol) .... Take One Tablet By Mouth Daily 2)  Celexa 20 Mg Tabs (Citalopram Hydrobromide) .... Take One Tablet By Mouth Daily 3)  Clonazepam 0.5 Mg Tabs (Clonazepam) .... As Needed 4)  Fluticasone Propionate 50 Mcg/act  Susp (Fluticasone Propionate) .... 2 Sprays Each Nostril Once Daily 5)  Singulair 10 Mg Tabs (Montelukast Sodium) .Marland Kitchen.. 1 By Mouth Daily 6)  Ra Fish Oil 1000 Mg Caps (Omega-3 Fatty Acids) .... 3 Tabs Daily 7)  Imitrex 100 Mg Tabs (Sumatriptan Succinate) .Marland Kitchen.. 1 By Mouth At The Onset of Migraine. May Repeat Dose in One Hour If Headache Not Resolved 8)  Trazodone Hcl 100 Mg Tabs (Trazodone Hcl) .Marland Kitchen.. 1 Tab By Mouth At Bedtime.  Allergies (verified): No Known Drug Allergies  Family History: Mom alive, healthy does not know biological father  Review of Systems      See HPI General:  Denies malaise. CV:  Denies chest pain or discomfort. Resp:  Denies shortness of breath.  Physical Exam  General:  Well-developed,well-nourished,in no acute distress; alert,appropriate and cooperative  throughout examination  Psych:  normal affect, talkative and pleasant    Impression & Recommendations:  Problem # 1:  CONTRACEPTIVE MANAGEMENT (ICD-V25.09) Assessment Unchanged Time spent with patient 15 minutes, more than 50% of this time was spent counseling patient on options. Discussed mirena along with other hormonal medications (Implananon, other OCPs, nuvaring). Pt will call her insurance company to find out about cost of Mirena since she feels this would be best option for her.  Complete Medication List: 1)  Ocella 3-0.03 Mg Tabs (Drospirenone-ethinyl estradiol) .... Take one tablet by mouth daily 2)  Celexa 20 Mg Tabs (Citalopram hydrobromide) .... Take one tablet by mouth daily 3)  Clonazepam 0.5 Mg Tabs (Clonazepam) .... As needed 4)  Fluticasone Propionate 50 Mcg/act Susp (Fluticasone propionate) .... 2 sprays each nostril once daily 5)  Singulair 10 Mg Tabs (Montelukast sodium) .Marland Kitchen.. 1 by mouth daily 6)  Ra Fish Oil 1000 Mg Caps (Omega-3 fatty acids) .... 3 tabs daily 7)  Imitrex 100 Mg Tabs (Sumatriptan succinate) .Marland Kitchen.. 1 by mouth at the onset of migraine. may repeat dose in one hour if headache not resolved 8)  Trazodone Hcl 100 Mg Tabs (Trazodone hcl) .Marland Kitchen.. 1 tab by mouth at bedtime.  Other Orders: Radiology Referral (Radiology)   Orders Added: 1)  Radiology Referral [Radiology] 2)  Est. Patient Level III [04540]    Current Allergies (reviewed today): No known  allergies

## 2010-12-13 ENCOUNTER — Encounter: Payer: Self-pay | Admitting: Family Medicine

## 2010-12-13 ENCOUNTER — Ambulatory Visit: Payer: Self-pay | Admitting: Family Medicine

## 2010-12-16 ENCOUNTER — Encounter: Payer: Self-pay | Admitting: Family Medicine

## 2010-12-17 ENCOUNTER — Telehealth: Payer: Self-pay | Admitting: *Deleted

## 2010-12-18 ENCOUNTER — Other Ambulatory Visit: Payer: Self-pay | Admitting: Family Medicine

## 2010-12-19 ENCOUNTER — Telehealth: Payer: Self-pay | Admitting: *Deleted

## 2010-12-19 ENCOUNTER — Other Ambulatory Visit: Payer: Self-pay | Admitting: Family Medicine

## 2010-12-19 MED ORDER — SUMATRIPTAN SUCCINATE 100 MG PO TABS: ORAL_TABLET | ORAL | Status: DC

## 2010-12-19 MED ORDER — TRAZODONE HCL 100 MG PO TABS: 100.0000 mg | ORAL_TABLET | Freq: Every day | ORAL | Status: DC

## 2010-12-19 NOTE — Telephone Encounter (Signed)
Rx for Trazodone and Imitrex sent via Rx encounter.

## 2010-12-19 NOTE — Telephone Encounter (Signed)
[facility]

## 2010-12-26 ENCOUNTER — Other Ambulatory Visit: Payer: Self-pay | Admitting: *Deleted

## 2010-12-26 MED ORDER — MONTELUKAST SODIUM 10 MG PO TABS: 10.0000 mg | ORAL_TABLET | Freq: Every day | ORAL | Status: DC

## 2010-12-26 NOTE — Letter (Signed)
Summary: Results Follow up Letter  [place] [facility]  [address] [place], [place] [address]   Phone: [phone]  Fax: [phone]    12/16/2010 MRN: [id number]  [facility] [address] [place], [place]  [address]  [place]  Dear Ms. BONANNO,  The following are the results of your recent test(s):  Test         Result    Pap Smear:        Normal _____  Not Normal _____ Comments: ______________________________________________________ Cholesterol: LDL(Bad cholesterol):         Your goal is less than:         HDL (Good cholesterol):       Your goal is more than: Comments:  ______________________________________________________ Mammogram:        Normal __X___  Not Normal _____ Comments:  ___________________________________________________________________ Hemoccult:        Normal _____  Not normal _______ Comments:    _____________________________________________________________________ Other Tests:    We routinely do not discuss normal results over the telephone.  If you desire a copy of the results, or you have any questions about this information we can discuss them at your next office visit.   Sincerely,     Dr. Ruthe Mannan

## 2011-01-10 ENCOUNTER — Other Ambulatory Visit: Payer: Self-pay | Admitting: *Deleted

## 2011-01-10 MED ORDER — SUMATRIPTAN SUCCINATE 100 MG PO TABS: ORAL_TABLET | ORAL | Status: DC

## 2011-01-10 NOTE — Telephone Encounter (Signed)
Rx called to pharmacy

## 2011-01-16 ENCOUNTER — Other Ambulatory Visit: Payer: Self-pay | Admitting: Family Medicine

## 2011-01-17 ENCOUNTER — Encounter: Payer: Self-pay | Admitting: Family Medicine

## 2011-01-20 ENCOUNTER — Encounter: Payer: Self-pay | Admitting: Family Medicine

## 2011-01-20 ENCOUNTER — Ambulatory Visit (INDEPENDENT_AMBULATORY_CARE_PROVIDER_SITE_OTHER): Payer: Managed Care, Other (non HMO) | Admitting: Family Medicine

## 2011-01-20 ENCOUNTER — Other Ambulatory Visit: Payer: Self-pay | Admitting: Family Medicine

## 2011-01-20 VITALS — BP 122/84 | HR 81 | Temp 98.4°F | Ht 67.0 in | Wt 185.1 lb

## 2011-01-20 DIAGNOSIS — F411 Generalized anxiety disorder: Secondary | ICD-10-CM

## 2011-01-20 MED ORDER — DROSPIRENONE-ETHINYL ESTRADIOL 3-0.03 MG PO TABS: 1.0000 | ORAL_TABLET | Freq: Every day | ORAL | Status: AC

## 2011-01-20 MED ORDER — TRAZODONE HCL 100 MG PO TABS: 100.0000 mg | ORAL_TABLET | Freq: Every day | ORAL | Status: AC

## 2011-01-20 MED ORDER — BUPROPION HCL ER (XL) 150 MG PO TB24: 150.0000 mg | ORAL_TABLET | ORAL | Status: DC

## 2011-01-20 NOTE — Progress Notes (Signed)
  Subjective:    Patient ID: Anna Estrada, female    DOB: [birth year], 45 y.o.   MRN: [id number]  HPI 45 yo here to discuss anxiety/depression. Was well controlled with Celexa 20 mg daily and Trazodone 100 mg qhs. Insomnia and tearfulness had improved. Now they are thinking about moving back to [place] and she is very nervous about this. Worsening fatigue and anhedonia because she really does not want to move. No SI or HI. Appetite is good.   Review of Systems See HPI  The PMH, PSH, Social History, Family History, Medications, and allergies have been reviewed in [facility], and have been updated if relevant.     Objective:   Physical Exam BP 122/84  Pulse 81  Temp(Src) 98.4 F (36.9 C) (Oral)  Ht 5\' 7"  (1.702 m)  Wt 185 lb 1.9 oz (83.97 kg)  BMI 28.99 kg/m2  LMP 01/15/2011  General:  Well-developed,well-nourished,in no acute distress; alert,appropriate and cooperative throughout examination Head:  normocephalic and atraumatic.   Eyes:  vision grossly intact, pupils equal, pupils round, and pupils reactive to light.   Psych:  Cognition and judgment appear intact. Alert and cooperative with normal attention span and concentration. No apparent delusions, illusions, hallucinations        Assessment & Plan:

## 2011-01-20 NOTE — Assessment & Plan Note (Signed)
Deteriorated. >25 min spent with face to face with patient counseling and coordinating care. Will add Wellbutrin 150 mg daily and wean off celexa in one month. Pt in agreement with plan.

## 2011-01-22 ENCOUNTER — Other Ambulatory Visit: Payer: Self-pay | Admitting: *Deleted

## 2011-01-22 MED ORDER — FLUTICASONE PROPIONATE (INHAL) 50 MCG/BLIST IN AEPB: 2.0000 | INHALATION_SPRAY | Freq: Every day | RESPIRATORY_TRACT | Status: DC

## 2011-01-23 ENCOUNTER — Other Ambulatory Visit: Payer: Self-pay | Admitting: *Deleted

## 2011-01-23 MED ORDER — FLUTICASONE PROPIONATE (INHAL) 50 MCG/BLIST IN AEPB: 2.0000 | INHALATION_SPRAY | Freq: Every day | RESPIRATORY_TRACT | Status: AC

## 2011-01-27 ENCOUNTER — Telehealth: Payer: Self-pay | Admitting: *Deleted

## 2011-01-27 NOTE — Telephone Encounter (Signed)
Flovent diskus was sent to pharmacy in error, script canceled at [facility].

## 2011-02-04 ENCOUNTER — Other Ambulatory Visit: Payer: Self-pay | Admitting: *Deleted

## 2011-02-05 MED ORDER — SUMATRIPTAN SUCCINATE 100 MG PO TABS: ORAL_TABLET | ORAL | Status: DC

## 2011-02-11 NOTE — Op Note (Signed)
Anna Estrada, Anna Estrada           ACCOUNT NO.:  [id number]   MEDICAL RECORD NO.:  [id number]          PATIENT TYPE:  OIB   LOCATION:  3599                         FACILITY:  [facility]   PHYSICIAN:  Clydene Fake, M.D.  DATE OF BIRTH:  [birth year]   DATE OF PROCEDURE:  01/04/2008  DATE OF DISCHARGE:                               OPERATIVE REPORT   PREOPERATIVE DIAGNOSIS:  Herniated nucleus pulposus, spondylosis, left  L5-S1.   POSTOPERATIVE DIAGNOSIS:  Herniated nucleus pulposus, spondylosis, left  L5-S1.   PROCEDURE:  Left L5-S1 semi-hemilaminectomy and diskectomy,  microdissection with microscope.   SURGEON:  Clydene Fake, M.D.   ASSISTANT:  Payton Doughty, M.D.   ANESTHESIA:  General endotracheal tube anesthesia.   ESTIMATED BLOOD LOSS:  Minimal.   DRAINS:  None.   COMPLICATIONS:  None.   REASON FOR PROCEDURE:  The patient is a 45 year old woman who has had  back and left leg pain and numbness.  Chiropractic care, physical  therapy, steroids, NSAIDs, have helped temporarily but have not resolved  the symptoms.  An MRI was done, showing disk herniation, left sided L5-  S1 __________ spondylotic change causing compression of the left S1  root.  The patient is brought in for decompression and diskectomy.   PROCEDURE IN DETAIL:  The patient was brought to the operating room and  general anesthesia was induced.  The patient was placed in the prone  position on a Wilson frame with all pressure points padded.  The patient  was prepped and draped in a sterile fashion.  The site of incision was  injected with 10 mL of 1% lidocaine with epinephrine.  The needle was  placed in the interspace.  X-rays were obtained, showing the needle was  pointing at the S1 spinous process.  An incision was then made centered  where the needle was.  The incision taken down to fascia.  Hemostasis  was obtained with Bovie cauterization.  The fascia was incised and  subperiosteal dissection was done  over the 2 spinous processes up to the  facets.  A self-retaining retractor was placed.  Markers were placed at  the interspace.  X-rays obtained showing we were at the 4-5 space.  We  dissected 1 level down, removed the self-retaining retractor, placed a  marker at the 5-1 space, took another x-ray, and this confirmed our  positioning at the L5-S1 disk space.  The microscope was brought in for  microdissection.  At this point a high-speed drill was used to start a  semi-hemilaminectomy and medial facetectomy.  Kerrison punches were used  to finish this foraminotomy done over the S1 root.  The ligament flavum  was removed, decompressing the posterior S1 root.  We explored the  epidural space and found large free fragments of disk up under the S1  root.  We removed this and found a large annular tear.  The disk space  was incised with a 15 blade and diskectomy continued with pituitary root  rongeurs and curettes.  When we were finished, we had good decompression  of the central canal and the S1 root.  We checked the L5 root; that was  also well decompressed.  We got hemostasis with bipolar cauterization  and Gelfoam and thrombin.  The Gelfoam was irrigated out.  We irrigated  with antibiotic solution and we still had a good hemostasis and good  decompression of the S1 root.  The retractor was removed.  The fascia  was closed with 0 Vicryl interrupted sutures.  The subcutaneous tissue  was closed with 2-0 and 3-0 Vicryl interrupted sutures.  The skin was  closed with benzoin, Steri-Strips.  A dressing was placed.  The patient  was placed back in a supine position, woken from anesthesia, and  transferred to the recovery room in stable condition.           ______________________________  Clydene Fake, M.D.     JRH/MEDQ  D:  01/04/2008  T:  01/04/2008  Job:  [id number]

## 2011-03-13 ENCOUNTER — Other Ambulatory Visit: Payer: Self-pay | Admitting: *Deleted

## 2011-03-13 MED ORDER — SUMATRIPTAN SUCCINATE 100 MG PO TABS: ORAL_TABLET | ORAL | Status: DC

## 2011-03-28 ENCOUNTER — Other Ambulatory Visit: Payer: Self-pay | Admitting: Family Medicine

## 2011-04-13 ENCOUNTER — Other Ambulatory Visit: Payer: Self-pay | Admitting: Family Medicine

## 2011-04-16 ENCOUNTER — Other Ambulatory Visit: Payer: Self-pay | Admitting: Family Medicine

## 2011-05-23 ENCOUNTER — Other Ambulatory Visit: Payer: Self-pay | Admitting: *Deleted

## 2011-05-23 MED ORDER — SUMATRIPTAN SUCCINATE 100 MG PO TABS: ORAL_TABLET | ORAL | Status: DC

## 2011-05-23 MED ORDER — CITALOPRAM HYDROBROMIDE 20 MG PO TABS: 20.0000 mg | ORAL_TABLET | Freq: Every day | ORAL | Status: DC

## 2011-05-23 NOTE — Telephone Encounter (Signed)
Received fax from [facility] in [place], [place] requesting refills for patient.  Patient requests these refills until she finds another doctor.  Please advise.

## 2011-05-27 ENCOUNTER — Other Ambulatory Visit: Payer: Self-pay | Admitting: Family Medicine

## 2011-06-05 ENCOUNTER — Other Ambulatory Visit: Payer: Self-pay | Admitting: Family Medicine

## 2011-06-24 LAB — CBC
Hemoglobin: 13.1
MCHC: 34.2
MCV: 89.4
RBC: 4.3
RDW: 12.3

## 2011-06-24 LAB — URINALYSIS, ROUTINE W REFLEX MICROSCOPIC
Glucose, UA: NEGATIVE
Hgb urine dipstick: NEGATIVE
pH: 6.5

## 2011-06-24 LAB — BASIC METABOLIC PANEL
CO2: 28
Calcium: 9.7
Chloride: 102
Creatinine, Ser: 0.73
GFR calc Af Amer: >60
Glucose, Bld: 92

## 2011-06-24 LAB — PROTIME-INR: Prothrombin Time: 13.3

## 2011-07-07 ENCOUNTER — Other Ambulatory Visit: Payer: Self-pay | Admitting: Family Medicine

## 2011-07-07 NOTE — Telephone Encounter (Signed)
OK to refill once with one refill but then needs to establish with local physician.

## 2011-07-07 NOTE — Telephone Encounter (Signed)
Rx sent electronically, advised pharmacy that patient needs to establish care with a local physician for further refills.

## 2011-08-09 ENCOUNTER — Other Ambulatory Visit: Payer: Self-pay | Admitting: Family Medicine

## 2011-08-10 ENCOUNTER — Other Ambulatory Visit: Payer: Self-pay | Admitting: Family Medicine

## 2011-08-11 NOTE — Telephone Encounter (Signed)
Rx filled electronically by Dr. Aron. 

## 2011-08-11 NOTE — Telephone Encounter (Signed)
Rx refilled electronically °

## 2011-09-10 ENCOUNTER — Other Ambulatory Visit: Payer: Self-pay | Admitting: Family Medicine

## 2011-10-21 ENCOUNTER — Other Ambulatory Visit: Payer: Self-pay | Admitting: Family Medicine

## 2011-10-21 NOTE — Telephone Encounter (Signed)
Electronic Refill Request

## 2011-12-19 ENCOUNTER — Other Ambulatory Visit: Payer: Self-pay | Admitting: Family Medicine

## 2012-01-08 IMAGING — US ABDOMEN ULTRASOUND
1 series · 17 of 25 positions shown · non-contrast
Comparison: none

REASON FOR EXAM: RUQ pain
COMMENTS:

PROCEDURE:     US  - US ABDOMEN GENERAL SURVEY  - August 27, 2010  [DATE]
RESULT:     Comparison: None
TECHNIQUE: Multiple gray-scale and color-flow Doppler images of the abdomen
are presented for review.

[Series 1: abdomen ultrasound · 17 of 85 slices shown]
[im 1/85]
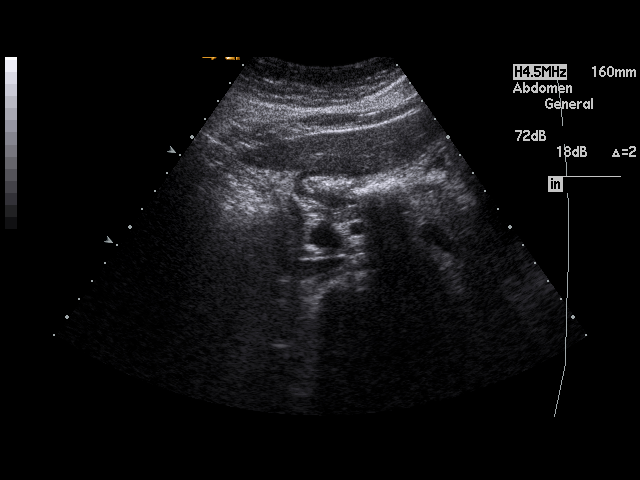
[im 8/85]
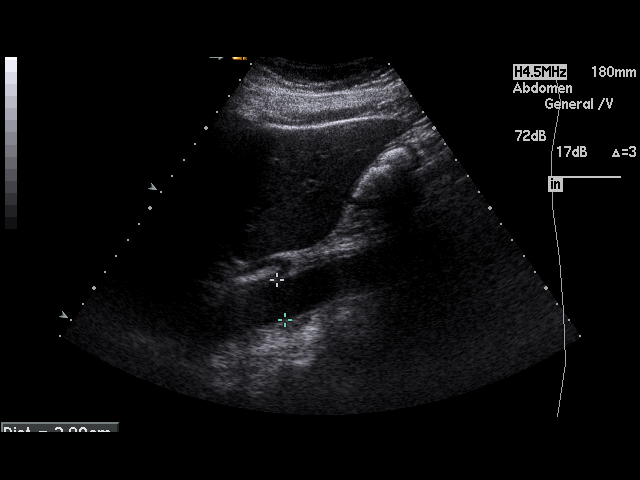
[im 11/85]
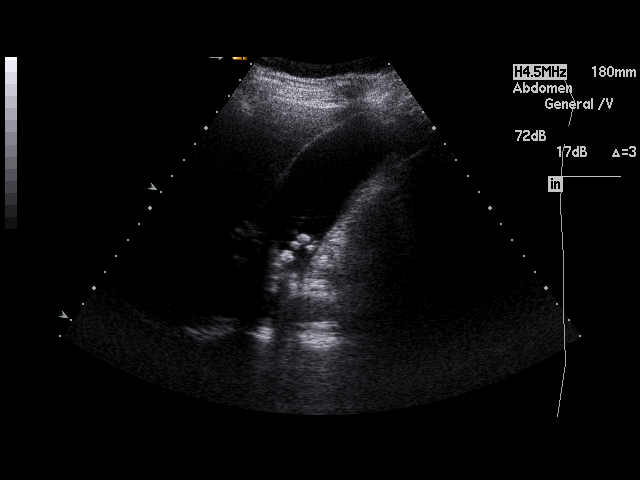
[im 18/85]
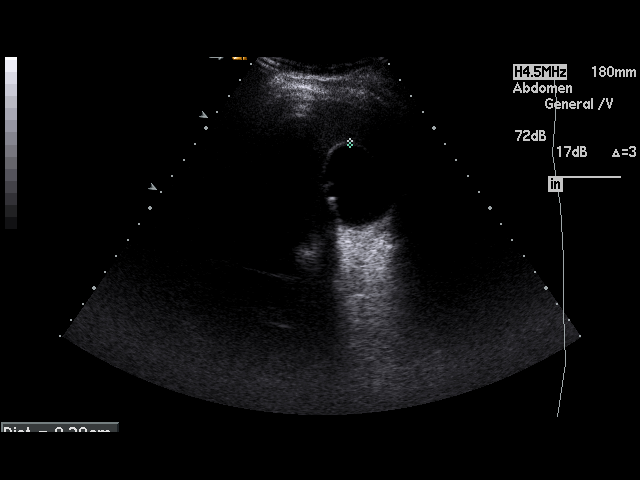
[im 22/85]
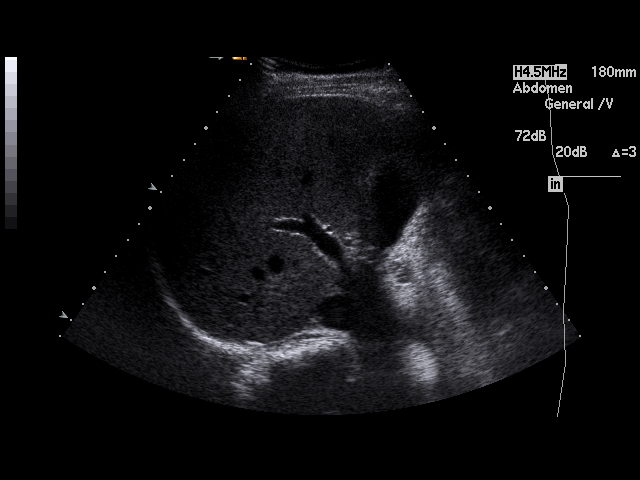
[im 29/85]
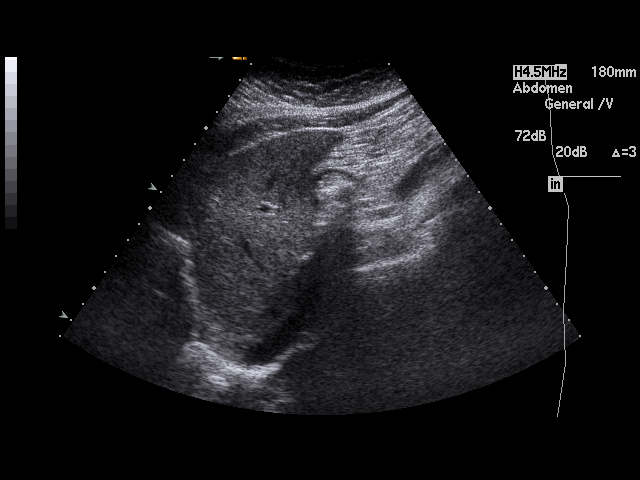
[im 32/85]
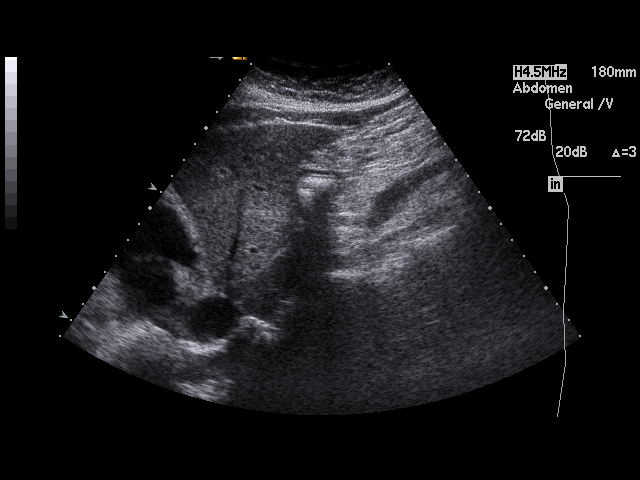
[im 39/85]
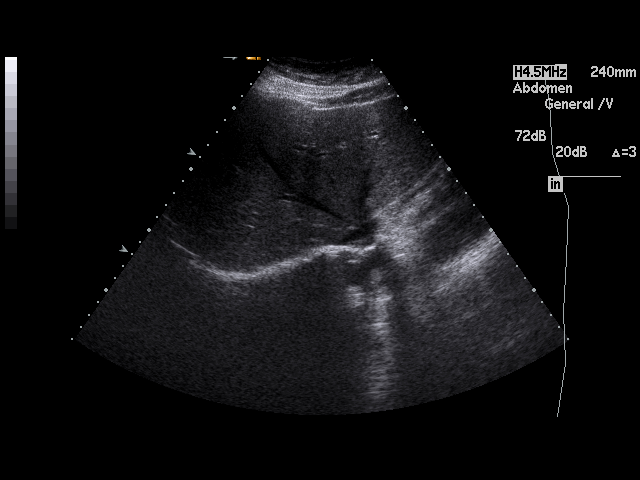
[im 43/85]
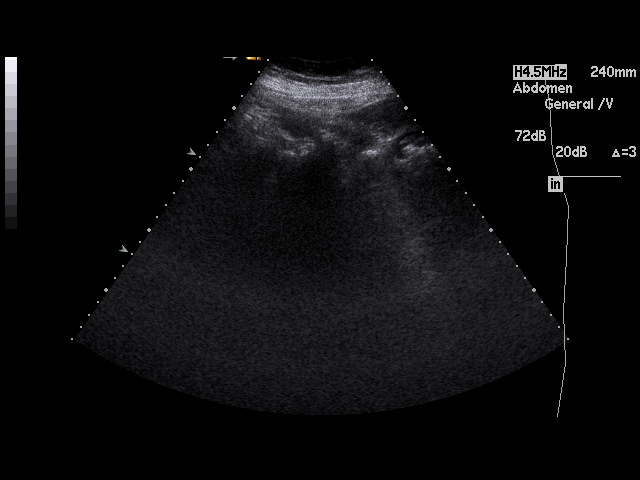
[im 46/85]
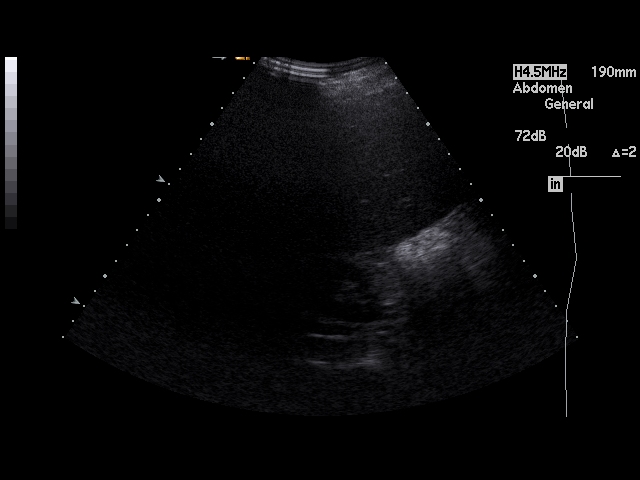
[im 53/85]
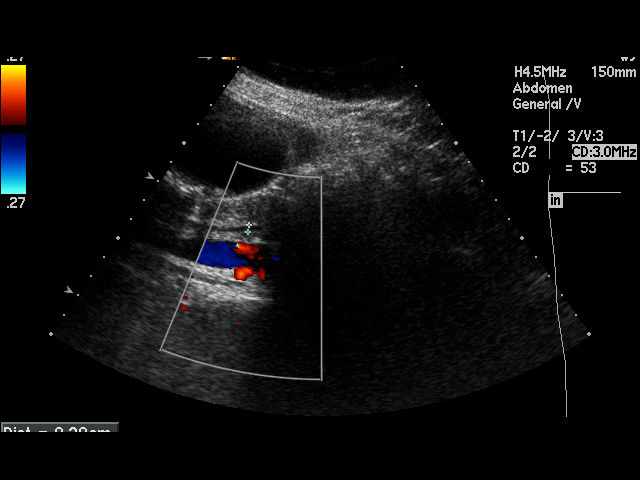
[im 57/85]
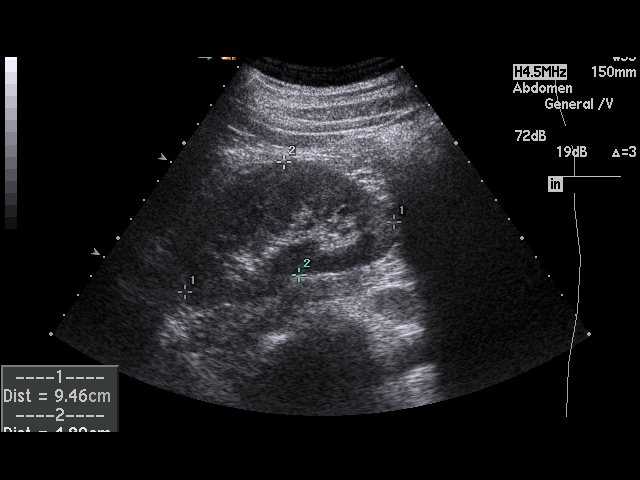
[im 64/85]
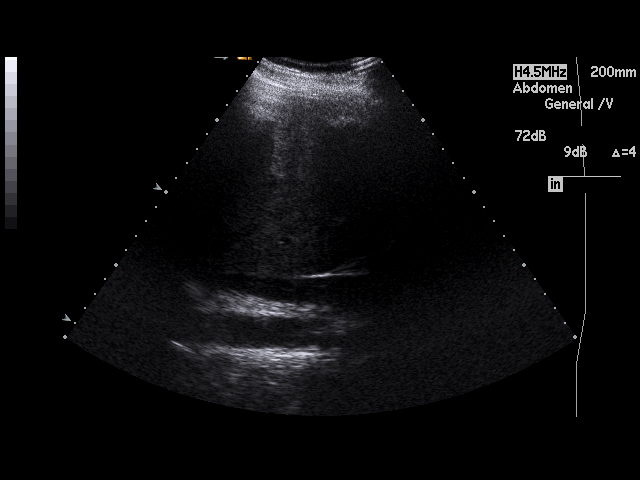
[im 67/85]
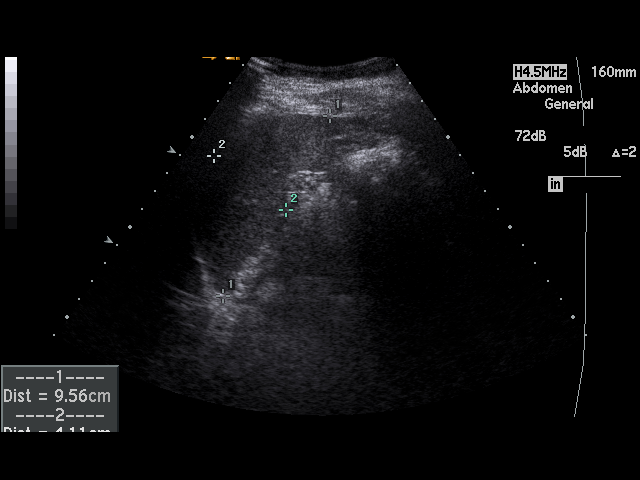
[im 74/85]
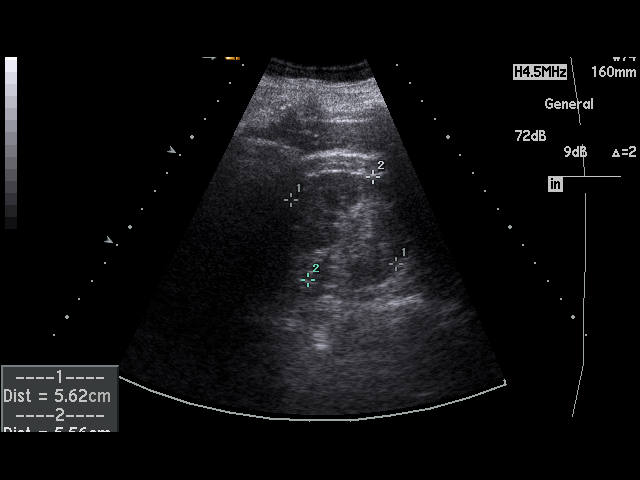
[im 78/85]
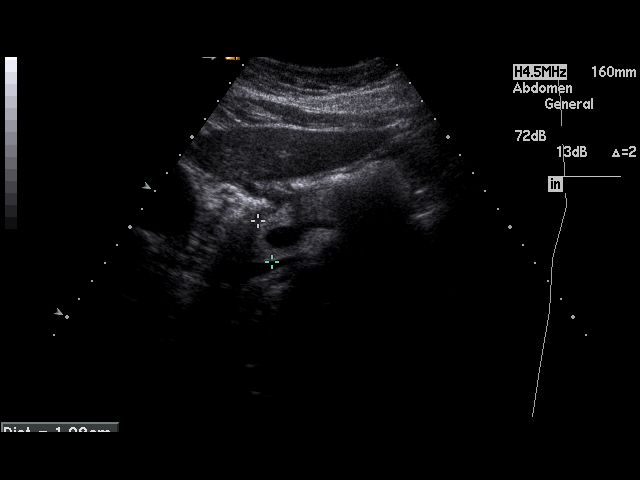
[im 85/85]
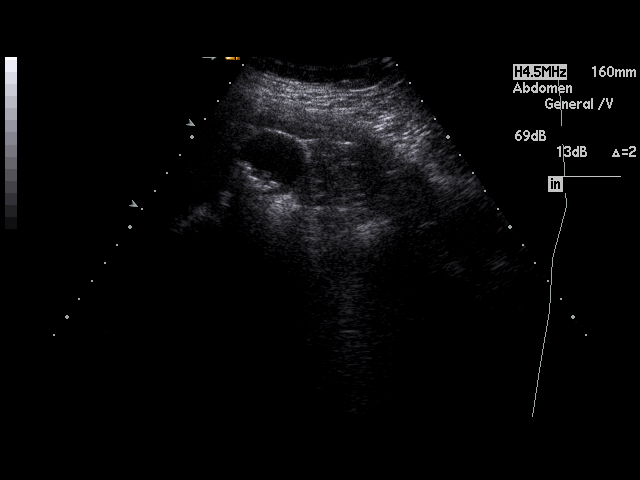

[17 of 25 positions shown; findings below may reference images not displayed]

FINDINGS: Visualized portions of the liver demonstrate normal echogenicity and normal
contours. The liver is without evidence of a focal hepatic lesion.

There are cholelithiasis. There is no intra- or extrahepatic biliary ductal
dilatation. The common duct measures 4.9 mm in maximal diameter. There is no
gallbladder wall thickening, pericholecystic fluid, or sonographic Murphy's
sign.

The visualized portion of the pancreas is normal in echogenicity. The spleen
is unremarkable. Bilateral kidneys are normal in echogenicity and size. The
right kidney measures 9.9 x 4.8 x 4.6 cm. The left kidney measures 10.2 x
5.6 x 5.5 cm. There are no renal calculi or hydronephrosis. The abdominal
aorta and IVC are unremarkable.
IMPRESSION: Cholelithiasis without sonographic evidence of acute cholecystitis.

## 2012-01-26 ENCOUNTER — Other Ambulatory Visit: Payer: Self-pay | Admitting: Family Medicine

## 2012-01-26 NOTE — Telephone Encounter (Signed)
Refusal called to pharmacy

## 2012-02-27 ENCOUNTER — Other Ambulatory Visit: Payer: Self-pay | Admitting: Family Medicine

## 2012-03-29 ENCOUNTER — Other Ambulatory Visit: Payer: Self-pay | Admitting: Family Medicine

## 2019-02-04 ENCOUNTER — Telehealth: Payer: Self-pay | Admitting: Family Medicine

## 2019-02-04 NOTE — Telephone Encounter (Signed)
Called and left vm for patient. Calling to verify PCP. Patient has not been seen by Dr. Dayton Martes since 2012
